# Patient Record
Sex: Female | Born: 1950 | Race: White | Hispanic: No | Marital: Married | Smoking: Never smoker
Health system: Southern US, Community
[De-identification: ages and names within clinical notes are randomized; demographics above are authoritative.]

## PROBLEM LIST (undated history)

## (undated) DIAGNOSIS — M329 Systemic lupus erythematosus, unspecified: Secondary | ICD-10-CM

## (undated) DIAGNOSIS — H409 Unspecified glaucoma: Secondary | ICD-10-CM

## (undated) DIAGNOSIS — E7800 Pure hypercholesterolemia, unspecified: Secondary | ICD-10-CM

## (undated) DIAGNOSIS — IMO0002 Reserved for concepts with insufficient information to code with codable children: Secondary | ICD-10-CM

## (undated) HISTORY — PX: JOINT REPLACEMENT: SHX530

## (undated) HISTORY — PX: EYE SURGERY: SHX253

## (undated) HISTORY — PX: BREAST SURGERY: SHX581

---

## 2004-05-05 ENCOUNTER — Encounter: Admission: RE | Admit: 2004-05-05 | Discharge: 2004-05-05 | Payer: Self-pay

## 2008-05-30 ENCOUNTER — Emergency Department (HOSPITAL_BASED_OUTPATIENT_CLINIC_OR_DEPARTMENT_OTHER): Admission: EM | Admit: 2008-05-30 | Discharge: 2008-05-30 | Payer: Self-pay | Admitting: Emergency Medicine

## 2008-05-30 ENCOUNTER — Ambulatory Visit: Payer: Self-pay | Admitting: Diagnostic Radiology

## 2011-04-03 LAB — COMPREHENSIVE METABOLIC PANEL
BUN: 16 mg/dL (ref 6–23)
CO2: 27 mEq/L (ref 19–32)
Calcium: 9.8 mg/dL (ref 8.4–10.5)
GFR calc non Af Amer: >60 mL/min (ref >60)
Glucose, Bld: 119 mg/dL — ABNORMAL HIGH (ref 70–99)
Total Protein: 7.9 g/dL (ref 6.0–8.3)

## 2011-04-03 LAB — URINALYSIS, ROUTINE W REFLEX MICROSCOPIC
Glucose, UA: NEGATIVE mg/dL
Ketones, ur: 15 mg/dL — AB
Nitrite: NEGATIVE
Protein, ur: 100 mg/dL — AB
pH: 6 (ref 5.0–8.0)

## 2011-04-03 LAB — CBC
HCT: 48.8 % — ABNORMAL HIGH (ref 36.0–46.0)
Hemoglobin: 16.5 g/dL — ABNORMAL HIGH (ref 12.0–15.0)
MCHC: 33.9 g/dL (ref 30.0–36.0)
Platelets: 268 10*3/uL (ref 150–400)
RDW: 12.2 % (ref 11.5–15.5)

## 2011-04-03 LAB — URINE MICROSCOPIC-ADD ON

## 2011-04-03 LAB — DIFFERENTIAL
Basophils Absolute: 0 10*3/uL (ref 0.0–0.1)
Basophils Relative: 1 % (ref 0–1)
Eosinophils Relative: 2 % (ref 0–5)
Lymphocytes Relative: 19 % (ref 12–46)
Monocytes Absolute: 0.4 10*3/uL (ref 0.1–1.0)

## 2019-08-13 ENCOUNTER — Ambulatory Visit: Payer: Medicare Other

## 2019-12-09 ENCOUNTER — Emergency Department (HOSPITAL_BASED_OUTPATIENT_CLINIC_OR_DEPARTMENT_OTHER): Payer: Medicare Other

## 2019-12-09 ENCOUNTER — Emergency Department (HOSPITAL_BASED_OUTPATIENT_CLINIC_OR_DEPARTMENT_OTHER)
Admission: EM | Admit: 2019-12-09 | Discharge: 2019-12-09 | Disposition: A | Discharge status: Home / Self Care | Payer: Medicare Other | Attending: Emergency Medicine | Admitting: Emergency Medicine

## 2019-12-09 ENCOUNTER — Encounter (HOSPITAL_BASED_OUTPATIENT_CLINIC_OR_DEPARTMENT_OTHER): Payer: Self-pay | Admitting: Emergency Medicine

## 2019-12-09 ENCOUNTER — Other Ambulatory Visit: Payer: Self-pay

## 2019-12-09 DIAGNOSIS — Z882 Allergy status to sulfonamides status: Secondary | ICD-10-CM | POA: Insufficient documentation

## 2019-12-09 DIAGNOSIS — R519 Headache, unspecified: Secondary | ICD-10-CM | POA: Insufficient documentation

## 2019-12-09 DIAGNOSIS — H40219 Acute angle-closure glaucoma, unspecified eye: Secondary | ICD-10-CM | POA: Insufficient documentation

## 2019-12-09 DIAGNOSIS — R42 Dizziness and giddiness: Secondary | ICD-10-CM | POA: Diagnosis not present

## 2019-12-09 HISTORY — DX: Pure hypercholesterolemia, unspecified: E78.00

## 2019-12-09 HISTORY — DX: Unspecified glaucoma: H40.9

## 2019-12-09 HISTORY — DX: Systemic lupus erythematosus, unspecified: M32.9

## 2019-12-09 HISTORY — DX: Reserved for concepts with insufficient information to code with codable children: IMO0002

## 2019-12-09 LAB — BASIC METABOLIC PANEL
Anion gap: 7 (ref 5–15)
BUN: 16 mg/dL (ref 8–23)
CO2: 27 mmol/L (ref 22–32)
Calcium: 9 mg/dL (ref 8.9–10.3)
Chloride: 105 mmol/L (ref 98–111)
Creatinine, Ser: 0.99 mg/dL (ref 0.44–1.00)
GFR calc Af Amer: >60 mL/min (ref >60)
GFR calc non Af Amer: 58 mL/min — ABNORMAL LOW (ref >60)
Glucose, Bld: 85 mg/dL (ref 70–99)
Potassium: 4.7 mmol/L (ref 3.5–5.1)
Sodium: 139 mmol/L (ref 135–145)

## 2019-12-09 LAB — CBC WITH DIFFERENTIAL/PLATELET
Abs Immature Granulocytes: 0.05 10*3/uL (ref 0.00–0.07)
Basophils Absolute: 0.1 10*3/uL (ref 0.0–0.1)
Basophils Relative: 1 %
Eosinophils Absolute: 0.1 10*3/uL (ref 0.0–0.5)
Eosinophils Relative: 2 %
HCT: 49.6 % — ABNORMAL HIGH (ref 36.0–46.0)
Hemoglobin: 16.3 g/dL — ABNORMAL HIGH (ref 12.0–15.0)
Immature Granulocytes: 1 %
Lymphocytes Relative: 23 %
Lymphs Abs: 1.7 10*3/uL (ref 0.7–4.0)
MCH: 31.8 pg (ref 26.0–34.0)
MCHC: 32.9 g/dL (ref 30.0–36.0)
MCV: 96.9 fL (ref 80.0–100.0)
Monocytes Absolute: 0.7 10*3/uL (ref 0.1–1.0)
Monocytes Relative: 9 %
Neutro Abs: 4.9 10*3/uL (ref 1.7–7.7)
Neutrophils Relative %: 64 %
Platelets: 234 10*3/uL (ref 150–400)
RBC: 5.12 MIL/uL — ABNORMAL HIGH (ref 3.87–5.11)
RDW: 12.6 % (ref 11.5–15.5)
WBC: 7.5 10*3/uL (ref 4.0–10.5)
nRBC: 0 % (ref 0.0–0.2)

## 2019-12-09 LAB — TSH: TSH: 1.138 u[IU]/mL (ref 0.350–4.500)

## 2019-12-09 MED ORDER — SODIUM CHLORIDE 0.9 % IV BOLUS
500.0000 mL | Freq: Once | INTRAVENOUS | Status: AC
  Administered 2019-12-09: 500 mL via INTRAVENOUS

## 2019-12-09 NOTE — ED Notes (Signed)
Patient transported to CT 

## 2019-12-09 NOTE — ED Provider Notes (Signed)
[facility] Provider Note   CSN: [id number] Arrival date & time: 12/09/19  4627     History Chief Complaint  Patient presents with  . Dizziness    Kristy Hawkins is a 69 y.o. female.  Pt presents to the ED today with a headache that has been persistent since February.  She has associated dizziness.  Right now, her headache is not too bad; but it gets intense at times.  Dizziness is not there now.  Pt went to UC who told her to come to the ED.  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  She denies any trouble with walking or swallowing.  She has occasional word finding difficulty.  She was able to drive here today.        Past Medical History:  Diagnosis Date  . Glaucoma   . High cholesterol   . Lupus ([place])     There are no problems to display for this patient.   Past Surgical History:  Procedure Laterality Date  . BREAST SURGERY Left   . EYE SURGERY    . JOINT REPLACEMENT       OB History   No obstetric history on file.     No family history on file.  Social History   Tobacco Use  . Smoking status: Never Smoker  . Smokeless tobacco: Never Used  Substance Use Topics  . Alcohol use: Yes    Comment: 4 ounces  . Drug use: Never    Home Medications Prior to Admission medications   Not on File    Allergies    Sulfa antibiotics  Review of Systems   Review of Systems  Neurological: Positive for dizziness and headaches.  All other systems reviewed and are negative.   Physical Exam Updated Vital Signs BP (!) 125/109 (BP Location: Right Arm)   Pulse 66   Temp 97.9 F (36.6 C) (Oral)   Resp 18   Ht 5\' 3"  (1.6 m)   Wt 86.2 kg   SpO2 98%   BMI 33.66 kg/m   Physical Exam Vitals and nursing note reviewed.  Constitutional:      Appearance: Normal appearance.  HENT:     Head: Normocephalic and atraumatic.     Right Ear: External ear normal.     Left Ear: External ear  normal.     Nose: Nose normal.     Mouth/Throat:     Mouth: Mucous membranes are moist.     Pharynx: Oropharynx is clear.  Eyes:     Extraocular Movements: Extraocular movements intact.     Conjunctiva/sclera: Conjunctivae normal.     Pupils: Pupils are equal, round, and reactive to light.  Cardiovascular:     Rate and Rhythm: Normal rate and regular rhythm.     Pulses: Normal pulses.     Heart sounds: Normal heart sounds.  Pulmonary:     Effort: Pulmonary effort is normal.     Breath sounds: Normal breath sounds.  Abdominal:     General: Abdomen is flat. Bowel sounds are normal.     Palpations: Abdomen is soft.  Musculoskeletal:        General: Normal range of motion.     Cervical back: Normal range of motion and neck supple.  Skin:    General: Skin is warm.     Capillary Refill: Capillary refill takes less than 2 seconds.  Neurological:     General:  No focal deficit present.     Mental Status: She is alert and oriented to person, place, and time.  Psychiatric:        Mood and Affect: Mood normal.        Behavior: Behavior normal.        Thought Content: Thought content normal.        Judgment: Judgment normal.     ED Results / Procedures / Treatments   Labs (all labs ordered are listed, but only abnormal results are displayed) Labs Reviewed  BASIC METABOLIC PANEL - Abnormal; Notable for the following components:      Result Value   GFR calc non Af Amer 58 (*)    All other components within normal limits  CBC WITH DIFFERENTIAL/PLATELET - Abnormal; Notable for the following components:   RBC 5.12 (*)    Hemoglobin 16.3 (*)    HCT 49.6 (*)    All other components within normal limits  TSH    EKG None  Radiology CT Head Wo Contrast  Result Date: 12/09/2019 CLINICAL DATA:  Vertigo. Right sided headache since February of this year. EXAM: CT HEAD WITHOUT CONTRAST TECHNIQUE: Contiguous axial images were obtained from the base of the skull through the vertex without  intravenous contrast. COMPARISON:  None. FINDINGS: Brain: Gray-white differentiation is maintained. No CT evidence of acute large territory infarct. No intraparenchymal or extra-axial mass or hemorrhage. Normal size and configuration of the ventricles and basilar cisterns. No midline shift. Vascular: No hyperdense vessel or unexpected calcification. Skull: No displaced calvarial fracture. Sinuses/Orbits: Limited visualization the paranasal sinuses and mastoid air cells is normal. No air-fluid levels. Post right-sided cataract surgery. Other: Regional soft tissues appear normal. IMPRESSION: Negative noncontrast head CT. Electronically Signed   By: Simonne Come M.D.   On: 12/09/2019 10:14   MR BRAIN WO CONTRAST  Result Date: 12/09/2019 CLINICAL DATA:  Ataxia with stroke suspected EXAM: MRI HEAD WITHOUT CONTRAST TECHNIQUE: Multiplanar, multiecho pulse sequences of the brain and surrounding structures were obtained without intravenous contrast. COMPARISON:  Head CT from earlier today FINDINGS: Brain: No acute infarction, hemorrhage, hydrocephalus, extra-axial collection or mass lesion. Normal brain volume and white matter appearance Vascular: Normal flow voids, including vertebrobasilar Skull and upper cervical spine: Normal marrow signal. Sinuses/Orbits: Unremarkable. The mastoid and middle ear spaces are clear. Right cataract resection IMPRESSION: Normal MRI of the brain. Electronically Signed   By: Marnee Spring M.D.   On: 12/09/2019 12:50    Procedures Procedures (including critical care time)  Medications Ordered in ED Medications  sodium chloride 0.9 % bolus 500 mL (0 mLs Intravenous Stopped 12/09/19 1105)    ED Course  I have reviewed the triage vital signs and the nursing notes.  Pertinent labs & imaging results that were available during my care of the patient were reviewed by me and considered in my medical decision making (see chart for details).    MDM Rules/Calculators/A&P                           Pt said tylenol relieves her headache and does not want anything more.  The dizziness may be due to vertigo which she's had in the past.  She is given instructions on how to do the Epley maneuver.  Pt's CT and MRI are nl.  No stroke.  Pt is stable for d/c.  Return if worse.    Final Clinical Impression(s) / ED Diagnoses Final diagnoses:  Acute  nonintractable headache, unspecified headache type  Vertigo    Rx / DC Orders ED Discharge Orders    None       Jacalyn Lefevre, MD 12/09/19 1301

## 2019-12-09 NOTE — ED Notes (Signed)
Patient transported to MRI 

## 2019-12-09 NOTE — ED Triage Notes (Signed)
Referred by Urgent Care due to ongoing dizziness, headaches and nausea

## 2021-03-26 IMAGING — CT CT HEAD W/O CM
3 series · 16 of 47 positions shown, 19 images · non-contrast
Comparison: None.

CLINICAL DATA: Vertigo. Right sided headache since [REDACTED].

EXAM:
CT HEAD WITHOUT CONTRAST
TECHNIQUE: Contiguous axial images were obtained from the base of the skull
through the vertex without intravenous contrast.

[Series 2: head wo · axial · 0.43mm/px · z∈[-179,-54]mm · 10 of 31 slices shown, 13 images]
[im 3/31  brain]
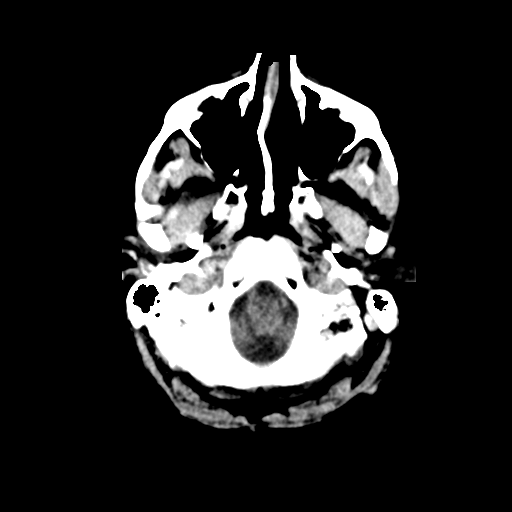
[im 3/31  bone]
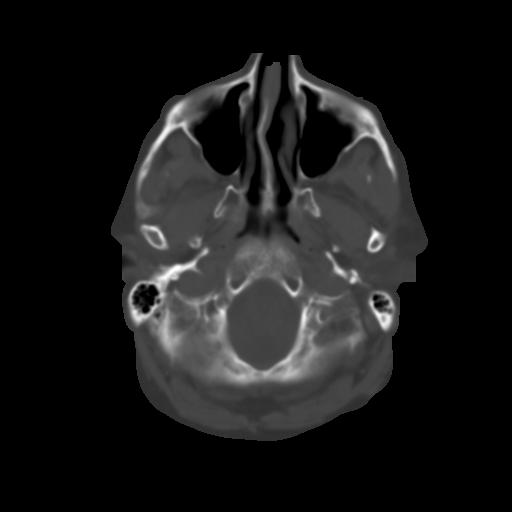
[im 6/31  brain]
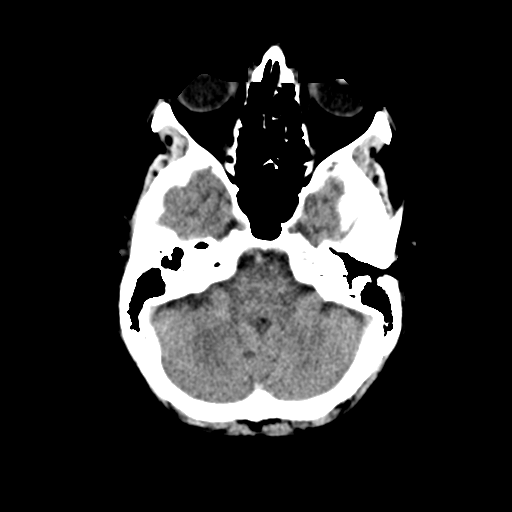
[im 9/31  brain]
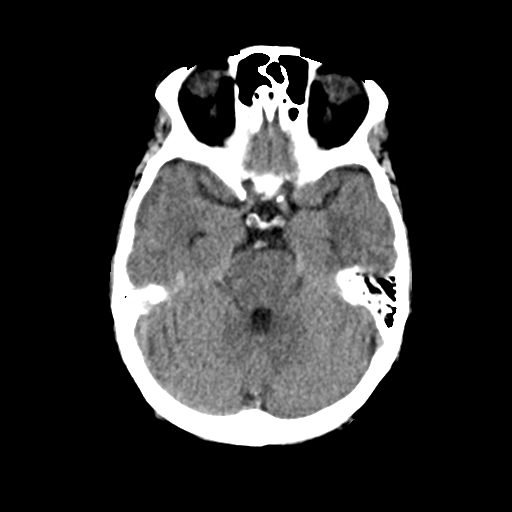
[im 11/31  brain]
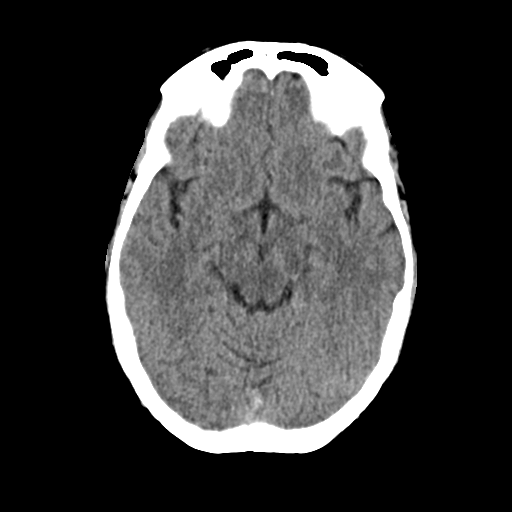
[im 14/31  brain]
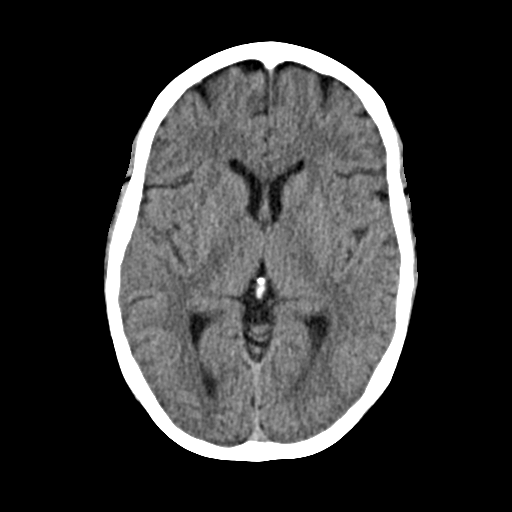
[im 14/31  bone]
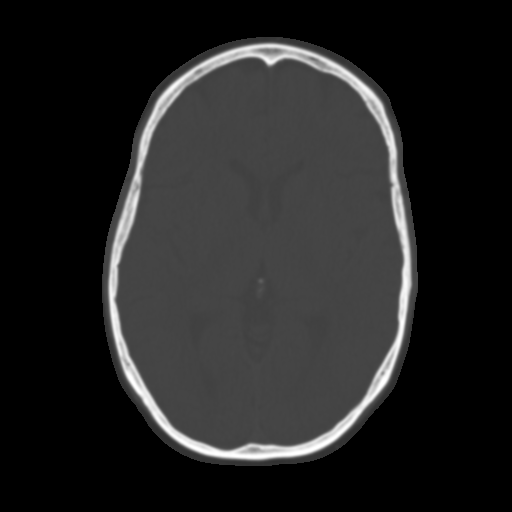
[im 17/31  brain]
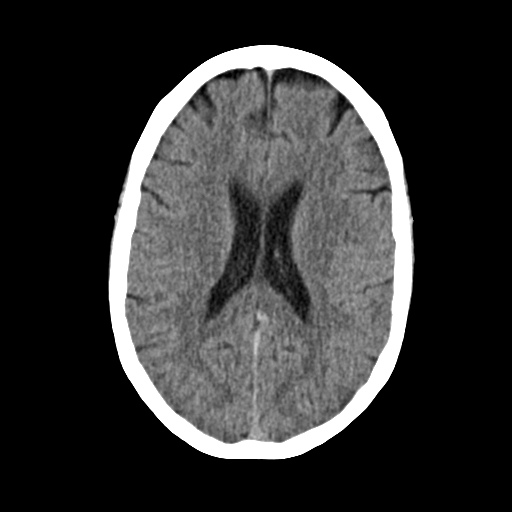
[im 20/31  brain]
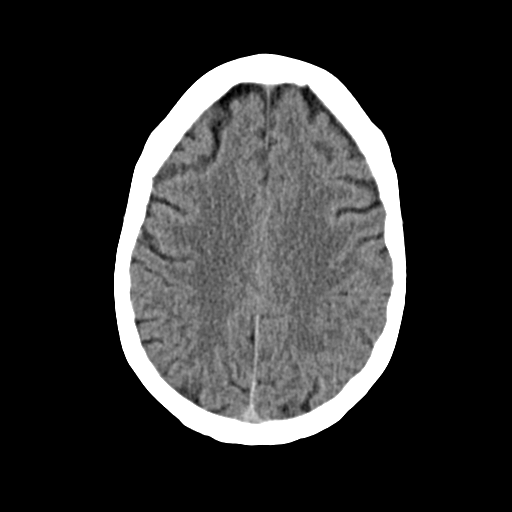
[im 23/31  brain]
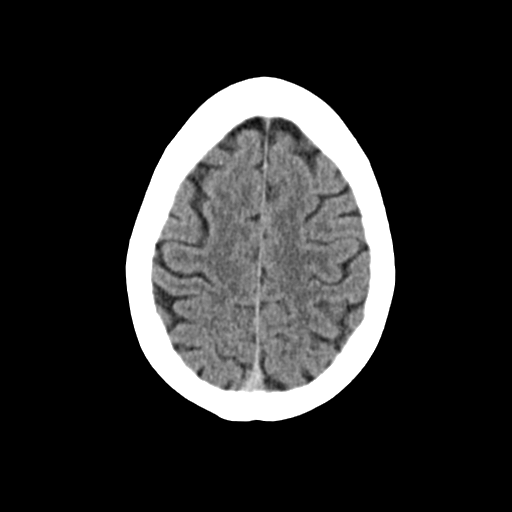
[im 25/31  brain]
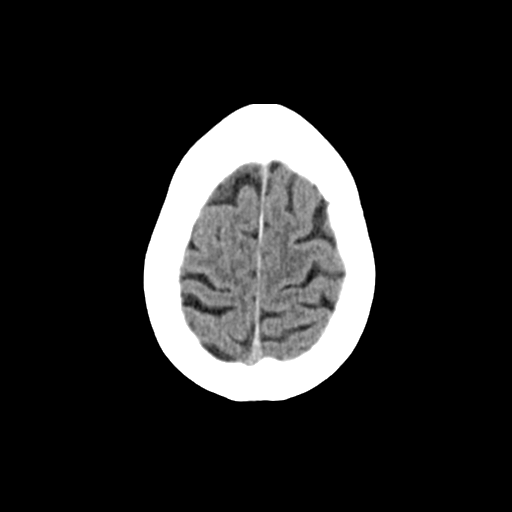
[im 25/31  bone]
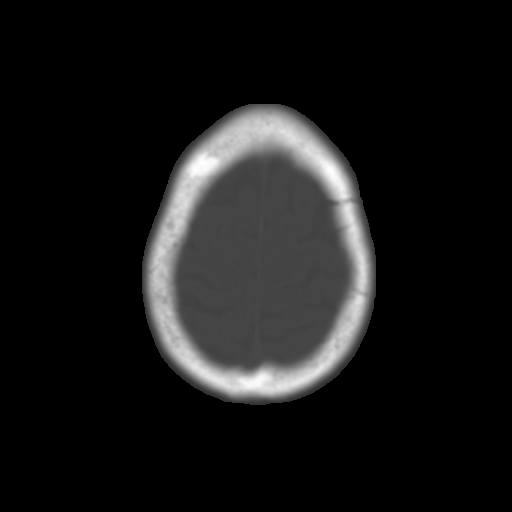
[im 28/31  brain]
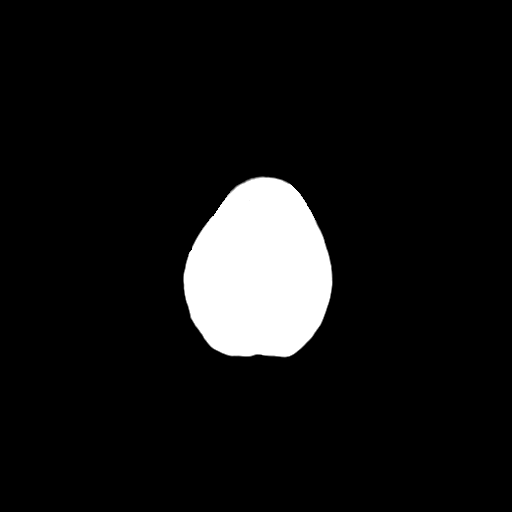

[Series 4: coronal soft · coronal · 0.32mm/px · 3 of 66 slices shown]
[im 22/66  brain]
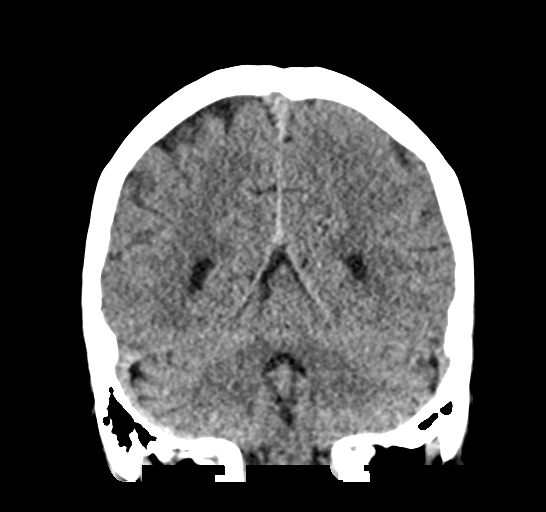
[im 29/66  brain]
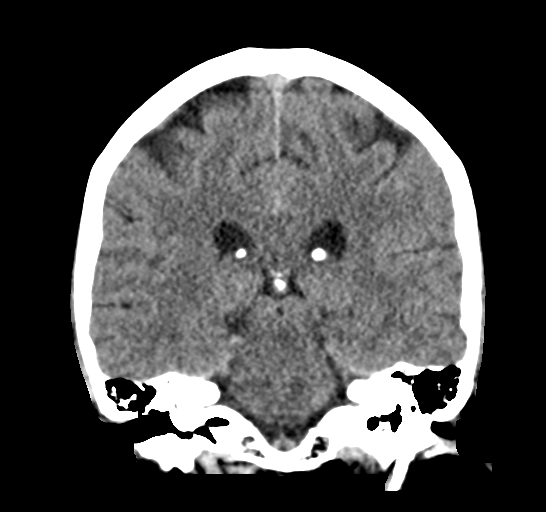
[im 37/66  brain]
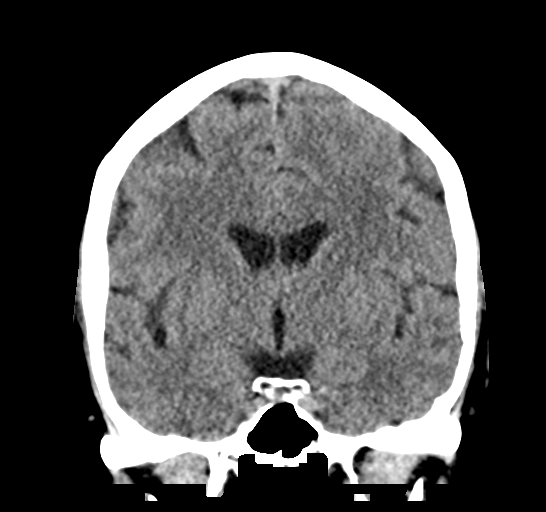

[Series 5: sag soft · sagittal · 0.32mm/px · 3 of 51 slices shown]
[im 17/51  brain]
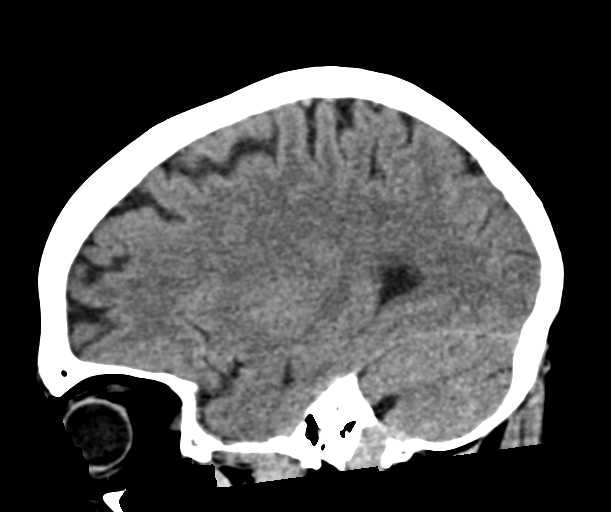
[im 26/51  brain]
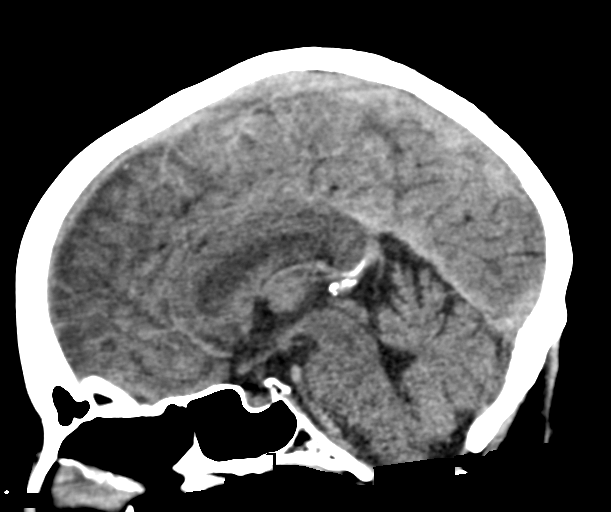
[im 34/51  brain]
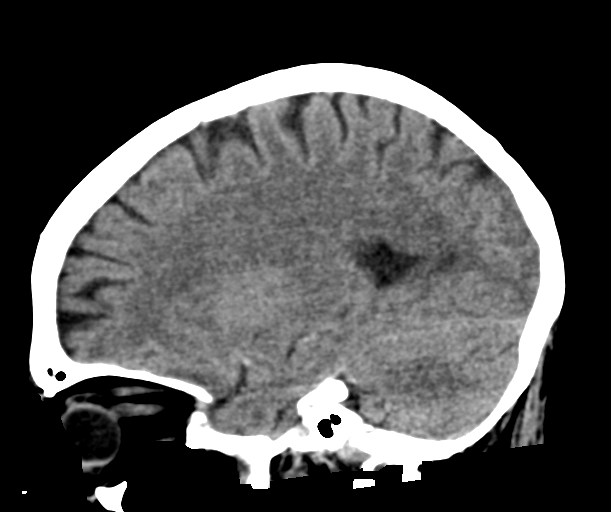

[16 of 47 positions shown; findings below may reference images not displayed]

FINDINGS: Brain: Gray-white differentiation is maintained. No CT evidence of
acute large territory infarct. No intraparenchymal or extra-axial
mass or hemorrhage. Normal size and configuration of the ventricles
and basilar cisterns. No midline shift.

Vascular: No hyperdense vessel or unexpected calcification.

Skull: No displaced calvarial fracture.

Sinuses/Orbits: Limited visualization the paranasal sinuses and
mastoid air cells is normal. No air-fluid levels. Post right-sided
cataract surgery.

Other: Regional soft tissues appear normal.
IMPRESSION: Negative noncontrast head CT.

## 2023-06-16 ENCOUNTER — Other Ambulatory Visit (HOSPITAL_BASED_OUTPATIENT_CLINIC_OR_DEPARTMENT_OTHER): Payer: Self-pay

## 2023-06-16 MED ORDER — WEGOVY 0.5 MG/0.5ML ~~LOC~~ SOAJ
0.5000 mg | SUBCUTANEOUS | 5 refills | Status: AC
  Filled 2023-06-16: qty 2, 28d supply, fill #0

## 2023-06-17 ENCOUNTER — Other Ambulatory Visit (HOSPITAL_BASED_OUTPATIENT_CLINIC_OR_DEPARTMENT_OTHER): Payer: Self-pay

## 2023-07-12 ENCOUNTER — Other Ambulatory Visit (HOSPITAL_BASED_OUTPATIENT_CLINIC_OR_DEPARTMENT_OTHER): Payer: Self-pay

## 2023-07-12 MED ORDER — WEGOVY 0.5 MG/0.5ML ~~LOC~~ SOAJ
0.5000 mg | SUBCUTANEOUS | 5 refills | Status: AC
  Filled 2023-07-12: qty 2, 28d supply, fill #0

## 2023-07-15 ENCOUNTER — Other Ambulatory Visit (HOSPITAL_BASED_OUTPATIENT_CLINIC_OR_DEPARTMENT_OTHER): Payer: Self-pay

## 2023-07-20 ENCOUNTER — Other Ambulatory Visit (HOSPITAL_BASED_OUTPATIENT_CLINIC_OR_DEPARTMENT_OTHER): Payer: Self-pay

## 2023-07-27 ENCOUNTER — Other Ambulatory Visit (HOSPITAL_BASED_OUTPATIENT_CLINIC_OR_DEPARTMENT_OTHER): Payer: Self-pay

## 2023-07-27 MED ORDER — OZEMPIC (1 MG/DOSE) 4 MG/3ML ~~LOC~~ SOPN
1.0000 mg | PEN_INJECTOR | SUBCUTANEOUS | 5 refills | Status: AC
  Filled 2023-07-27: qty 3, 28d supply, fill #0
  Filled 2023-08-11 (×2): qty 3, 28d supply, fill #1
  Filled 2023-08-31: qty 3, 28d supply, fill #2

## 2023-08-11 ENCOUNTER — Other Ambulatory Visit (HOSPITAL_BASED_OUTPATIENT_CLINIC_OR_DEPARTMENT_OTHER): Payer: Self-pay

## 2023-08-31 ENCOUNTER — Other Ambulatory Visit (HOSPITAL_BASED_OUTPATIENT_CLINIC_OR_DEPARTMENT_OTHER): Payer: Self-pay

## 2023-09-06 ENCOUNTER — Other Ambulatory Visit (HOSPITAL_BASED_OUTPATIENT_CLINIC_OR_DEPARTMENT_OTHER): Payer: Self-pay

## 2023-09-06 MED ORDER — GABAPENTIN 300 MG PO CAPS
600.0000 mg | ORAL_CAPSULE | Freq: Every day | ORAL | 1 refills | Status: AC
  Filled 2023-09-06 – 2024-05-05 (×2): qty 180, 90d supply, fill #0

## 2023-09-06 MED ORDER — OZEMPIC (2 MG/DOSE) 8 MG/3ML ~~LOC~~ SOPN
2.0000 mg | PEN_INJECTOR | SUBCUTANEOUS | 3 refills | Status: AC
  Filled 2023-09-06 – 2023-09-24 (×2): qty 3, 28d supply, fill #0

## 2023-09-24 ENCOUNTER — Other Ambulatory Visit (HOSPITAL_BASED_OUTPATIENT_CLINIC_OR_DEPARTMENT_OTHER): Payer: Self-pay

## 2023-10-14 ENCOUNTER — Other Ambulatory Visit (HOSPITAL_BASED_OUTPATIENT_CLINIC_OR_DEPARTMENT_OTHER): Payer: Self-pay

## 2023-10-14 MED ORDER — OZEMPIC (2 MG/DOSE) 8 MG/3ML ~~LOC~~ SOPN
2.0000 mg | PEN_INJECTOR | SUBCUTANEOUS | 3 refills | Status: AC
  Filled 2023-10-14 – 2023-10-18 (×2): qty 3, 28d supply, fill #0

## 2023-10-18 ENCOUNTER — Other Ambulatory Visit (HOSPITAL_BASED_OUTPATIENT_CLINIC_OR_DEPARTMENT_OTHER): Payer: Self-pay

## 2023-11-10 ENCOUNTER — Other Ambulatory Visit (HOSPITAL_BASED_OUTPATIENT_CLINIC_OR_DEPARTMENT_OTHER): Payer: Self-pay

## 2023-11-10 MED ORDER — GABAPENTIN 300 MG PO CAPS
600.0000 mg | ORAL_CAPSULE | Freq: Every day | ORAL | 1 refills | Status: AC
  Filled 2023-11-10: qty 180, 90d supply, fill #0

## 2023-11-11 ENCOUNTER — Other Ambulatory Visit (HOSPITAL_BASED_OUTPATIENT_CLINIC_OR_DEPARTMENT_OTHER): Payer: Self-pay

## 2023-11-11 MED ORDER — OZEMPIC (2 MG/DOSE) 8 MG/3ML ~~LOC~~ SOPN
2.0000 mg | PEN_INJECTOR | SUBCUTANEOUS | 3 refills | Status: AC
  Filled 2023-11-11: qty 6, 84d supply, fill #0
  Filled 2023-11-12: qty 3, 30d supply, fill #0
  Filled 2023-12-07: qty 3, 28d supply, fill #1

## 2023-11-12 ENCOUNTER — Other Ambulatory Visit (HOSPITAL_BASED_OUTPATIENT_CLINIC_OR_DEPARTMENT_OTHER): Payer: Self-pay

## 2023-12-03 ENCOUNTER — Other Ambulatory Visit (HOSPITAL_BASED_OUTPATIENT_CLINIC_OR_DEPARTMENT_OTHER): Payer: Self-pay

## 2023-12-03 MED ORDER — ATORVASTATIN CALCIUM 40 MG PO TABS
40.0000 mg | ORAL_TABLET | Freq: Every day | ORAL | 5 refills | Status: AC
  Filled 2023-12-03: qty 30, 30d supply, fill #0
  Filled 2023-12-28: qty 30, 30d supply, fill #1

## 2023-12-07 ENCOUNTER — Other Ambulatory Visit (HOSPITAL_BASED_OUTPATIENT_CLINIC_OR_DEPARTMENT_OTHER): Payer: Self-pay

## 2023-12-07 ENCOUNTER — Other Ambulatory Visit: Payer: Self-pay

## 2023-12-09 ENCOUNTER — Other Ambulatory Visit (HOSPITAL_BASED_OUTPATIENT_CLINIC_OR_DEPARTMENT_OTHER): Payer: Self-pay

## 2023-12-10 ENCOUNTER — Other Ambulatory Visit (HOSPITAL_BASED_OUTPATIENT_CLINIC_OR_DEPARTMENT_OTHER): Payer: Self-pay

## 2023-12-10 MED ORDER — OZEMPIC (2 MG/DOSE) 8 MG/3ML ~~LOC~~ SOPN
2.0000 mg | PEN_INJECTOR | SUBCUTANEOUS | 3 refills | Status: AC
  Filled 2023-12-10: qty 3, 28d supply, fill #0

## 2023-12-10 MED ORDER — TIMOLOL MALEATE 0.5 % OP SOLN
1.0000 [drp] | Freq: Two times a day (BID) | OPHTHALMIC | 3 refills | Status: AC
  Filled 2024-02-01: qty 15, 90d supply, fill #0
  Filled 2024-04-24: qty 15, 90d supply, fill #1
  Filled 2024-07-25: qty 15, 90d supply, fill #2

## 2024-01-03 ENCOUNTER — Other Ambulatory Visit (HOSPITAL_BASED_OUTPATIENT_CLINIC_OR_DEPARTMENT_OTHER): Payer: Self-pay

## 2024-01-03 MED ORDER — OZEMPIC (2 MG/DOSE) 8 MG/3ML ~~LOC~~ SOPN
2.0000 mg | PEN_INJECTOR | SUBCUTANEOUS | 3 refills | Status: AC
  Filled 2024-01-03: qty 3, 28d supply, fill #0

## 2024-01-04 ENCOUNTER — Other Ambulatory Visit (HOSPITAL_BASED_OUTPATIENT_CLINIC_OR_DEPARTMENT_OTHER): Payer: Self-pay

## 2024-01-21 ENCOUNTER — Other Ambulatory Visit (HOSPITAL_BASED_OUTPATIENT_CLINIC_OR_DEPARTMENT_OTHER): Payer: Self-pay

## 2024-01-21 MED ORDER — DIPHENOXYLATE-ATROPINE 2.5-0.025 MG PO TABS
1.0000 | ORAL_TABLET | Freq: Three times a day (TID) | ORAL | 0 refills | Status: AC | PRN
  Filled 2024-01-21: qty 20, 7d supply, fill #0

## 2024-01-31 ENCOUNTER — Other Ambulatory Visit (HOSPITAL_BASED_OUTPATIENT_CLINIC_OR_DEPARTMENT_OTHER): Payer: Self-pay

## 2024-01-31 MED ORDER — ATORVASTATIN CALCIUM 40 MG PO TABS
40.0000 mg | ORAL_TABLET | Freq: Every day | ORAL | 5 refills | Status: AC
  Filled 2024-01-31: qty 30, 30d supply, fill #0

## 2024-02-01 ENCOUNTER — Other Ambulatory Visit (HOSPITAL_BASED_OUTPATIENT_CLINIC_OR_DEPARTMENT_OTHER): Payer: Self-pay

## 2024-02-01 ENCOUNTER — Other Ambulatory Visit: Payer: Self-pay

## 2024-02-01 MED ORDER — HYDROXYCHLOROQUINE SULFATE 200 MG PO TABS
200.0000 mg | ORAL_TABLET | Freq: Every day | ORAL | 0 refills | Status: AC
  Filled 2024-02-01: qty 90, 90d supply, fill #0

## 2024-02-01 MED ORDER — OZEMPIC (2 MG/DOSE) 8 MG/3ML ~~LOC~~ SOPN
2.0000 mg | PEN_INJECTOR | SUBCUTANEOUS | 3 refills | Status: AC
  Filled 2024-02-01: qty 3, 28d supply, fill #0
  Filled 2024-03-01: qty 3, 28d supply, fill #1

## 2024-02-01 MED ORDER — HYDROXYCHLOROQUINE SULFATE 200 MG PO TABS
200.0000 mg | ORAL_TABLET | Freq: Every day | ORAL | 1 refills | Status: AC
  Filled 2024-02-01 – 2024-04-26 (×2): qty 90, 90d supply, fill #0

## 2024-02-07 ENCOUNTER — Other Ambulatory Visit (HOSPITAL_BASED_OUTPATIENT_CLINIC_OR_DEPARTMENT_OTHER): Payer: Self-pay

## 2024-02-07 MED ORDER — PREDNISONE 5 MG PO TABS
5.0000 mg | ORAL_TABLET | Freq: Every day | ORAL | 0 refills | Status: DC
  Filled 2024-02-07: qty 90, 90d supply, fill #0

## 2024-02-07 MED ORDER — GABAPENTIN 300 MG PO CAPS
600.0000 mg | ORAL_CAPSULE | Freq: Every day | ORAL | 1 refills | Status: AC
  Filled 2024-02-07: qty 180, 90d supply, fill #0

## 2024-02-11 ENCOUNTER — Other Ambulatory Visit (HOSPITAL_BASED_OUTPATIENT_CLINIC_OR_DEPARTMENT_OTHER): Payer: Self-pay

## 2024-02-11 MED ORDER — FINASTERIDE 5 MG PO TABS
5.0000 mg | ORAL_TABLET | Freq: Every day | ORAL | 3 refills | Status: AC
  Filled 2024-02-11: qty 90, 90d supply, fill #0
  Filled 2024-05-15: qty 90, 90d supply, fill #1

## 2024-02-11 MED ORDER — OMEPRAZOLE 40 MG PO CPDR
40.0000 mg | DELAYED_RELEASE_CAPSULE | Freq: Every day | ORAL | 1 refills | Status: AC
  Filled 2024-02-11: qty 90, 90d supply, fill #0

## 2024-02-29 ENCOUNTER — Other Ambulatory Visit: Payer: Self-pay

## 2024-02-29 ENCOUNTER — Other Ambulatory Visit (HOSPITAL_BASED_OUTPATIENT_CLINIC_OR_DEPARTMENT_OTHER): Payer: Self-pay

## 2024-02-29 MED ORDER — ATORVASTATIN CALCIUM 40 MG PO TABS
40.0000 mg | ORAL_TABLET | Freq: Every day | ORAL | 5 refills | Status: AC
  Filled 2024-02-29: qty 30, 30d supply, fill #0
  Filled 2024-06-20: qty 30, 30d supply, fill #1

## 2024-02-29 MED ORDER — OMEPRAZOLE 40 MG PO CPDR: 40.0000 mg | DELAYED_RELEASE_CAPSULE | Freq: Every day | ORAL | 1 refills | Status: AC

## 2024-03-01 ENCOUNTER — Other Ambulatory Visit (HOSPITAL_BASED_OUTPATIENT_CLINIC_OR_DEPARTMENT_OTHER): Payer: Self-pay

## 2024-03-24 ENCOUNTER — Other Ambulatory Visit (HOSPITAL_BASED_OUTPATIENT_CLINIC_OR_DEPARTMENT_OTHER): Payer: Self-pay

## 2024-03-24 MED ORDER — OZEMPIC (2 MG/DOSE) 8 MG/3ML ~~LOC~~ SOPN
2.0000 mg | PEN_INJECTOR | SUBCUTANEOUS | 3 refills | Status: AC
  Filled 2024-03-24: qty 3, 28d supply, fill #0

## 2024-03-27 ENCOUNTER — Other Ambulatory Visit (HOSPITAL_BASED_OUTPATIENT_CLINIC_OR_DEPARTMENT_OTHER): Payer: Self-pay

## 2024-03-29 ENCOUNTER — Other Ambulatory Visit (HOSPITAL_BASED_OUTPATIENT_CLINIC_OR_DEPARTMENT_OTHER): Payer: Self-pay

## 2024-03-29 MED ORDER — ATORVASTATIN CALCIUM 40 MG PO TABS
40.0000 mg | ORAL_TABLET | Freq: Every day | ORAL | 0 refills | Status: AC
  Filled 2024-03-29: qty 30, 30d supply, fill #0

## 2024-04-21 ENCOUNTER — Other Ambulatory Visit (HOSPITAL_BASED_OUTPATIENT_CLINIC_OR_DEPARTMENT_OTHER): Payer: Self-pay

## 2024-04-21 MED ORDER — OZEMPIC (2 MG/DOSE) 8 MG/3ML ~~LOC~~ SOPN
2.0000 mg | PEN_INJECTOR | SUBCUTANEOUS | 3 refills | Status: AC
  Filled 2024-04-21: qty 3, 28d supply, fill #0

## 2024-04-24 ENCOUNTER — Other Ambulatory Visit (HOSPITAL_BASED_OUTPATIENT_CLINIC_OR_DEPARTMENT_OTHER): Payer: Self-pay

## 2024-04-24 MED ORDER — FLUZONE 0.5 ML IM SUSY
0.5000 mL | PREFILLED_SYRINGE | Freq: Once | INTRAMUSCULAR | 0 refills | Status: DC
  Filled 2024-04-24: qty 0.5, 1d supply, fill #0

## 2024-04-26 ENCOUNTER — Other Ambulatory Visit (HOSPITAL_BASED_OUTPATIENT_CLINIC_OR_DEPARTMENT_OTHER): Payer: Self-pay

## 2024-04-26 ENCOUNTER — Other Ambulatory Visit: Payer: Self-pay

## 2024-04-26 MED ORDER — ATORVASTATIN CALCIUM 40 MG PO TABS
40.0000 mg | ORAL_TABLET | Freq: Every day | ORAL | 0 refills | Status: AC
  Filled 2024-04-26: qty 30, 30d supply, fill #0

## 2024-04-27 ENCOUNTER — Other Ambulatory Visit (HOSPITAL_BASED_OUTPATIENT_CLINIC_OR_DEPARTMENT_OTHER): Payer: Self-pay

## 2024-04-27 MED ORDER — PREDNISONE 5 MG PO TABS
5.0000 mg | ORAL_TABLET | Freq: Every day | ORAL | 0 refills | Status: DC
  Filled 2024-04-27 – 2024-04-28 (×2): qty 90, 90d supply, fill #0

## 2024-04-28 ENCOUNTER — Other Ambulatory Visit (HOSPITAL_BASED_OUTPATIENT_CLINIC_OR_DEPARTMENT_OTHER): Payer: Self-pay

## 2024-05-05 ENCOUNTER — Other Ambulatory Visit (HOSPITAL_BASED_OUTPATIENT_CLINIC_OR_DEPARTMENT_OTHER): Payer: Self-pay

## 2024-05-12 ENCOUNTER — Other Ambulatory Visit: Payer: Self-pay

## 2024-05-12 ENCOUNTER — Other Ambulatory Visit (HOSPITAL_BASED_OUTPATIENT_CLINIC_OR_DEPARTMENT_OTHER): Payer: Self-pay

## 2024-05-12 MED ORDER — DIPHENOXYLATE-ATROPINE 2.5-0.025 MG PO TABS
1.0000 | ORAL_TABLET | Freq: Three times a day (TID) | ORAL | 0 refills | Status: AC | PRN
  Filled 2024-05-12: qty 20, 7d supply, fill #0

## 2024-05-12 MED ORDER — GABAPENTIN 300 MG PO CAPS
600.0000 mg | ORAL_CAPSULE | Freq: Every evening | ORAL | 1 refills | Status: AC
  Filled 2024-05-12: qty 180, 90d supply, fill #0

## 2024-05-15 ENCOUNTER — Other Ambulatory Visit (HOSPITAL_BASED_OUTPATIENT_CLINIC_OR_DEPARTMENT_OTHER): Payer: Self-pay

## 2024-05-17 ENCOUNTER — Other Ambulatory Visit: Payer: Self-pay

## 2024-05-17 ENCOUNTER — Other Ambulatory Visit (HOSPITAL_BASED_OUTPATIENT_CLINIC_OR_DEPARTMENT_OTHER): Payer: Self-pay

## 2024-05-17 MED ORDER — OMEPRAZOLE 40 MG PO CPDR
40.0000 mg | DELAYED_RELEASE_CAPSULE | Freq: Every day | ORAL | 1 refills | Status: AC
  Filled 2024-05-17: qty 90, 90d supply, fill #0

## 2024-05-22 ENCOUNTER — Other Ambulatory Visit (HOSPITAL_BASED_OUTPATIENT_CLINIC_OR_DEPARTMENT_OTHER): Payer: Self-pay

## 2024-05-22 MED ORDER — OZEMPIC (2 MG/DOSE) 8 MG/3ML ~~LOC~~ SOPN
2.0000 mg | PEN_INJECTOR | SUBCUTANEOUS | 3 refills | Status: AC
  Filled 2024-05-22: qty 3, 28d supply, fill #0
  Filled 2024-06-23: qty 3, 28d supply, fill #1
  Filled 2024-07-17: qty 3, 28d supply, fill #2

## 2024-06-16 ENCOUNTER — Other Ambulatory Visit (HOSPITAL_BASED_OUTPATIENT_CLINIC_OR_DEPARTMENT_OTHER): Payer: Self-pay

## 2024-06-16 MED ORDER — WEGOVY 2.4 MG/0.75ML ~~LOC~~ SOAJ
2.4000 mg | SUBCUTANEOUS | 2 refills | Status: AC
  Filled 2024-06-16: qty 3, 28d supply, fill #0

## 2024-06-20 ENCOUNTER — Other Ambulatory Visit (HOSPITAL_BASED_OUTPATIENT_CLINIC_OR_DEPARTMENT_OTHER): Payer: Self-pay

## 2024-06-20 MED ORDER — WEGOVY 2.4 MG/0.75ML ~~LOC~~ SOAJ
2.4000 mg | SUBCUTANEOUS | 2 refills | Status: AC
  Filled 2024-06-20: qty 3, 28d supply, fill #0

## 2024-06-20 MED ORDER — PRAVASTATIN SODIUM 20 MG PO TABS
20.0000 mg | ORAL_TABLET | Freq: Every day | ORAL | 1 refills | Status: AC
  Filled 2024-06-20: qty 30, 30d supply, fill #0

## 2024-06-23 ENCOUNTER — Other Ambulatory Visit (HOSPITAL_BASED_OUTPATIENT_CLINIC_OR_DEPARTMENT_OTHER): Payer: Self-pay

## 2024-07-05 ENCOUNTER — Other Ambulatory Visit (HOSPITAL_BASED_OUTPATIENT_CLINIC_OR_DEPARTMENT_OTHER): Payer: Self-pay

## 2024-07-05 ENCOUNTER — Other Ambulatory Visit (HOSPITAL_COMMUNITY): Payer: Self-pay

## 2024-07-14 ENCOUNTER — Other Ambulatory Visit (HOSPITAL_BASED_OUTPATIENT_CLINIC_OR_DEPARTMENT_OTHER): Payer: Self-pay

## 2024-07-14 MED ORDER — ATORVASTATIN CALCIUM 40 MG PO TABS
40.0000 mg | ORAL_TABLET | Freq: Every day | ORAL | 3 refills | Status: AC
  Filled 2024-07-14: qty 90, 90d supply, fill #0

## 2024-07-17 ENCOUNTER — Other Ambulatory Visit (HOSPITAL_BASED_OUTPATIENT_CLINIC_OR_DEPARTMENT_OTHER): Payer: Self-pay

## 2024-07-20 ENCOUNTER — Other Ambulatory Visit (HOSPITAL_BASED_OUTPATIENT_CLINIC_OR_DEPARTMENT_OTHER): Payer: Self-pay

## 2024-07-20 MED ORDER — PREDNISONE 5 MG PO TABS
5.0000 mg | ORAL_TABLET | Freq: Every day | ORAL | 0 refills | Status: AC
  Filled 2024-07-20: qty 30, 30d supply, fill #0

## 2024-07-20 MED ORDER — WEGOVY 2.4 MG/0.75ML ~~LOC~~ SOAJ
2.4000 mg | SUBCUTANEOUS | 2 refills | Status: AC
  Filled 2024-07-20 – 2024-08-02 (×2): qty 3, 28d supply, fill #0

## 2024-07-21 ENCOUNTER — Other Ambulatory Visit (HOSPITAL_BASED_OUTPATIENT_CLINIC_OR_DEPARTMENT_OTHER): Payer: Self-pay

## 2024-07-24 ENCOUNTER — Other Ambulatory Visit (HOSPITAL_BASED_OUTPATIENT_CLINIC_OR_DEPARTMENT_OTHER): Payer: Self-pay

## 2024-07-26 ENCOUNTER — Other Ambulatory Visit (HOSPITAL_BASED_OUTPATIENT_CLINIC_OR_DEPARTMENT_OTHER): Payer: Self-pay

## 2024-07-27 ENCOUNTER — Other Ambulatory Visit (HOSPITAL_BASED_OUTPATIENT_CLINIC_OR_DEPARTMENT_OTHER): Payer: Self-pay

## 2024-07-27 MED ORDER — HYDROXYCHLOROQUINE SULFATE 200 MG PO TABS
200.0000 mg | ORAL_TABLET | Freq: Every day | ORAL | 2 refills | Status: AC
  Filled 2024-07-27: qty 30, 30d supply, fill #0

## 2024-07-28 ENCOUNTER — Other Ambulatory Visit (HOSPITAL_BASED_OUTPATIENT_CLINIC_OR_DEPARTMENT_OTHER): Payer: Self-pay

## 2024-07-31 ENCOUNTER — Other Ambulatory Visit (HOSPITAL_BASED_OUTPATIENT_CLINIC_OR_DEPARTMENT_OTHER): Payer: Self-pay

## 2024-07-31 MED ORDER — OZEMPIC (2 MG/DOSE) 8 MG/3ML ~~LOC~~ SOPN
2.0000 mg | PEN_INJECTOR | SUBCUTANEOUS | 2 refills | Status: AC
  Filled 2024-07-31: qty 3, 28d supply, fill #0

## 2024-08-02 ENCOUNTER — Other Ambulatory Visit (HOSPITAL_BASED_OUTPATIENT_CLINIC_OR_DEPARTMENT_OTHER): Payer: Self-pay

## 2024-08-03 ENCOUNTER — Other Ambulatory Visit (HOSPITAL_BASED_OUTPATIENT_CLINIC_OR_DEPARTMENT_OTHER): Payer: Self-pay

## 2024-08-03 ENCOUNTER — Other Ambulatory Visit: Payer: Self-pay

## 2024-08-03 MED ORDER — GABAPENTIN 300 MG PO CAPS
600.0000 mg | ORAL_CAPSULE | Freq: Every day | ORAL | 1 refills | Status: AC
  Filled 2024-08-03: qty 180, 90d supply, fill #0

## 2024-08-04 ENCOUNTER — Other Ambulatory Visit (HOSPITAL_BASED_OUTPATIENT_CLINIC_OR_DEPARTMENT_OTHER): Payer: Self-pay
